# Patient Record
Sex: Male | Born: 1995 | Race: Black or African American | Hispanic: No | Marital: Married | Smoking: Never smoker
Health system: Southern US, Community
[De-identification: ages and names within clinical notes are randomized; demographics above are authoritative.]

---
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
## 2016-02-18 ENCOUNTER — Inpatient Hospital Stay (HOSPITAL_COMMUNITY)
Admission: EM | Admit: 2016-02-18 | Discharge: 2016-02-20 | DRG: 155 | Disposition: A | Discharge status: Home / Self Care | Payer: Commercial Managed Care - HMO | Attending: Internal Medicine | Admitting: Internal Medicine

## 2016-02-18 ENCOUNTER — Emergency Department (HOSPITAL_COMMUNITY): Payer: Commercial Managed Care - HMO

## 2016-02-18 ENCOUNTER — Encounter (HOSPITAL_COMMUNITY): Payer: Self-pay | Admitting: *Deleted

## 2016-02-18 DIAGNOSIS — J351 Hypertrophy of tonsils: Principal | ICD-10-CM | POA: Diagnosis present

## 2016-02-18 DIAGNOSIS — N179 Acute kidney failure, unspecified: Secondary | ICD-10-CM | POA: Diagnosis present

## 2016-02-18 DIAGNOSIS — Z8249 Family history of ischemic heart disease and other diseases of the circulatory system: Secondary | ICD-10-CM | POA: Diagnosis not present

## 2016-02-18 DIAGNOSIS — J029 Acute pharyngitis, unspecified: Secondary | ICD-10-CM | POA: Diagnosis present

## 2016-02-18 DIAGNOSIS — R739 Hyperglycemia, unspecified: Secondary | ICD-10-CM | POA: Diagnosis present

## 2016-02-18 DIAGNOSIS — E860 Dehydration: Secondary | ICD-10-CM

## 2016-02-18 DIAGNOSIS — J039 Acute tonsillitis, unspecified: Secondary | ICD-10-CM | POA: Diagnosis present

## 2016-02-18 LAB — CBC WITH DIFFERENTIAL/PLATELET
Basophils Absolute: 0.4 10*3/uL — ABNORMAL HIGH (ref 0.0–0.1)
Basophils Relative: 2 %
Eosinophils Absolute: 0 10*3/uL (ref 0.0–0.7)
Eosinophils Relative: 0 %
HCT: 42.1 % (ref 39.0–52.0)
Hemoglobin: 13.7 g/dL (ref 13.0–17.0)
Lymphocytes Relative: 45 %
Lymphs Abs: 8.1 10*3/uL — ABNORMAL HIGH (ref 0.7–4.0)
MCH: 27.7 pg (ref 26.0–34.0)
MCHC: 32.5 g/dL (ref 30.0–36.0)
MCV: 85.2 fL (ref 78.0–100.0)
Monocytes Absolute: 2.3 10*3/uL — ABNORMAL HIGH (ref 0.1–1.0)
Monocytes Relative: 13 %
Neutro Abs: 7.2 10*3/uL (ref 1.7–7.7)
Neutrophils Relative %: 40 %
Platelets: 258 10*3/uL (ref 150–400)
RBC: 4.94 MIL/uL (ref 4.22–5.81)
RDW: 13.1 % (ref 11.5–15.5)
WBC: 18 10*3/uL — ABNORMAL HIGH (ref 4.0–10.5)

## 2016-02-18 LAB — I-STAT CHEM 8, ED
BUN: 22 mg/dL — ABNORMAL HIGH (ref 6–20)
Calcium, Ion: 1.11 mmol/L — ABNORMAL LOW (ref 1.15–1.40)
Chloride: 95 mmol/L — ABNORMAL LOW (ref 101–111)
Creatinine, Ser: 1.3 mg/dL — ABNORMAL HIGH (ref 0.61–1.24)
Glucose, Bld: 96 mg/dL (ref 65–99)
HCT: 46 % (ref 39.0–52.0)
Hemoglobin: 15.6 g/dL (ref 13.0–17.0)
Potassium: 3.7 mmol/L (ref 3.5–5.1)
Sodium: 137 mmol/L (ref 135–145)
TCO2: 31 mmol/L (ref 0–100)

## 2016-02-18 LAB — URINALYSIS, ROUTINE W REFLEX MICROSCOPIC
BILIRUBIN URINE: NEGATIVE
GLUCOSE, UA: NEGATIVE mg/dL
HGB URINE DIPSTICK: NEGATIVE
Ketones, ur: NEGATIVE mg/dL
Leukocytes, UA: NEGATIVE
Nitrite: NEGATIVE
Protein, ur: NEGATIVE mg/dL
SPECIFIC GRAVITY, URINE: 1.025 (ref 1.005–1.030)
pH: 7 (ref 5.0–8.0)

## 2016-02-18 MED ORDER — SODIUM CHLORIDE 0.9 % IV SOLN
3.0000 g | Freq: Once | INTRAVENOUS | Status: AC
  Administered 2016-02-18: 3 g via INTRAVENOUS
  Filled 2016-02-18: qty 3

## 2016-02-18 MED ORDER — SODIUM CHLORIDE 0.9 % IV BOLUS (SEPSIS)
1000.0000 mL | Freq: Once | INTRAVENOUS | Status: AC
  Administered 2016-02-18: 1000 mL via INTRAVENOUS

## 2016-02-18 MED ORDER — FENTANYL CITRATE (PF) 100 MCG/2ML IJ SOLN
50.0000 ug | Freq: Once | INTRAMUSCULAR | Status: AC
  Administered 2016-02-18: 50 ug via INTRAVENOUS
  Filled 2016-02-18: qty 2

## 2016-02-18 MED ORDER — SENNA 8.6 MG PO TABS
1.0000 | ORAL_TABLET | Freq: Two times a day (BID) | ORAL | Status: DC
  Administered 2016-02-18 – 2016-02-20 (×3): 8.6 mg via ORAL
  Filled 2016-02-18 (×4): qty 1

## 2016-02-18 MED ORDER — ACETAMINOPHEN 650 MG RE SUPP: 650.0000 mg | Freq: Four times a day (QID) | RECTAL | Status: DC | PRN

## 2016-02-18 MED ORDER — ONDANSETRON HCL 4 MG PO TABS: 4.0000 mg | ORAL_TABLET | Freq: Four times a day (QID) | ORAL | Status: DC | PRN

## 2016-02-18 MED ORDER — IOPAMIDOL (ISOVUE-300) INJECTION 61%
75.0000 mL | Freq: Once | INTRAVENOUS | Status: AC | PRN
  Administered 2016-02-18: 75 mL via INTRAVENOUS

## 2016-02-18 MED ORDER — METHYLPREDNISOLONE SODIUM SUCC 125 MG IJ SOLR
125.0000 mg | Freq: Once | INTRAMUSCULAR | Status: AC
  Administered 2016-02-18: 125 mg via INTRAVENOUS
  Filled 2016-02-18: qty 2

## 2016-02-18 MED ORDER — ONDANSETRON HCL 4 MG/2ML IJ SOLN: 4.0000 mg | Freq: Four times a day (QID) | INTRAMUSCULAR | Status: DC | PRN

## 2016-02-18 MED ORDER — SODIUM CHLORIDE 0.9 % IV SOLN
3.0000 g | Freq: Four times a day (QID) | INTRAVENOUS | Status: DC
  Administered 2016-02-18 – 2016-02-20 (×7): 3 g via INTRAVENOUS
  Filled 2016-02-18 (×8): qty 3

## 2016-02-18 MED ORDER — KETOROLAC TROMETHAMINE 30 MG/ML IJ SOLN
30.0000 mg | Freq: Once | INTRAMUSCULAR | Status: AC
  Administered 2016-02-18: 30 mg via INTRAVENOUS
  Filled 2016-02-18: qty 1

## 2016-02-18 MED ORDER — OXYCODONE HCL 5 MG PO TABS
5.0000 mg | ORAL_TABLET | ORAL | Status: DC | PRN
  Administered 2016-02-18 – 2016-02-20 (×6): 5 mg via ORAL
  Filled 2016-02-18 (×6): qty 1

## 2016-02-18 MED ORDER — ALBUTEROL SULFATE (2.5 MG/3ML) 0.083% IN NEBU: 2.5000 mg | INHALATION_SOLUTION | RESPIRATORY_TRACT | Status: DC | PRN

## 2016-02-18 MED ORDER — ONDANSETRON HCL 4 MG/2ML IJ SOLN
4.0000 mg | Freq: Once | INTRAMUSCULAR | Status: AC
  Administered 2016-02-18: 4 mg via INTRAVENOUS
  Filled 2016-02-18: qty 2

## 2016-02-18 MED ORDER — SODIUM CHLORIDE 0.9 % IV SOLN
INTRAVENOUS | Status: AC
  Administered 2016-02-18 – 2016-02-19 (×2): via INTRAVENOUS

## 2016-02-18 MED ORDER — MORPHINE SULFATE (PF) 2 MG/ML IV SOLN
1.0000 mg | INTRAVENOUS | Status: DC | PRN
  Administered 2016-02-18 – 2016-02-19 (×4): 2 mg via INTRAVENOUS
  Filled 2016-02-18 (×5): qty 1

## 2016-02-18 MED ORDER — ACETAMINOPHEN 325 MG PO TABS: 650.0000 mg | ORAL_TABLET | Freq: Four times a day (QID) | ORAL | Status: DC | PRN

## 2016-02-18 MED ORDER — PHENOL 1.4 % MT LIQD
1.0000 | OROMUCOSAL | Status: DC | PRN
  Administered 2016-02-18: 1 via OROMUCOSAL
  Filled 2016-02-18: qty 177

## 2016-02-18 NOTE — ED Notes (Signed)
RN at bedside collecting labs 

## 2016-02-18 NOTE — ED Provider Notes (Signed)
[facility]-EMERGENCY DEPT Provider Note   CSN: [id number] Arrival date & time: 02/18/16  1332  By signing my name below, I, Soijett Blue, attest that this documentation has been prepared under the direction and in the presence of Rolan BuccoMelanie Azyriah Nevins, MD. Electronically Signed: Soijett Blue, ED Scribe. 02/18/16. 2:40 PM.   History   Chief Complaint Chief Complaint  Patient presents with  . Sore Throat    HPI  Jason Davis is a 20 y.o. male who presents to the Emergency Department complaining of sore throat onset 2.5-3 weeks worsening today. Father states that they took the patient to the dentist first and was then seen by his PCP. Pt reports that following being seen by his PCP he was dx with mono on 9/27 after a positive mono test and had a negative strep test. Pt states that he was then started on a prednisone taper. Pt notes that he began to have blood-tinged sputum and inability to swallow secretions due to sore throat x today and that is what prompted him to come into the ED. Pt denies recent abx use. He states that he is having associated symptoms of blood-tinged sputum, trouble swallowing, dental pain, appetite change, and left neck pain. He states that he has tried Rx prednisone with no relief for his symptoms. He denies nausea, vomiting, fever, abdominal pain, rash, body aches, and any other symptoms.   The history is provided by the patient. No language interpreter was used.    History reviewed. No pertinent past medical history.  Patient Active Problem List   Diagnosis Date Noted  . Tonsillitis, phlegmonous 02/18/2016  . Tonsillitis with exudate 02/18/2016    History reviewed. No pertinent surgical history.     Home Medications    Prior to Admission medications   Medication Sig Start Date End Date Taking? Authorizing Provider  ibuprofen (ADVIL,MOTRIN) 200 MG tablet Take 400 mg by mouth every 6 (six) hours as needed for moderate pain.   Yes Historical Provider, MD    predniSONE (DELTASONE) 20 MG tablet Take 3 tablets daily x 3 days; 2 tablets daily x 3 days; 1 tablet daily x 3 days 02/10/16  Yes Historical Provider, MD    Family History Family History  Problem Relation Age of Onset  . Hypertension Father     Social History Social History  Substance Use Topics  . Smoking status: Never Smoker  . Smokeless tobacco: Never Used  . Alcohol use No     Allergies   Review of patient's allergies indicates no known allergies.   Review of Systems Review of Systems  Constitutional: Positive for fatigue. Negative for chills, diaphoresis and fever.  HENT: Positive for sore throat and trouble swallowing. Negative for congestion, rhinorrhea and sneezing.        +Blood-tinged sputum  Eyes: Negative.   Respiratory: Negative for cough, chest tightness and shortness of breath.   Cardiovascular: Negative for chest pain and leg swelling.  Gastrointestinal: Negative for abdominal pain, blood in stool, diarrhea, nausea and vomiting.  Genitourinary: Negative for difficulty urinating, flank pain, frequency and hematuria.  Musculoskeletal: Positive for neck pain (left sided). Negative for arthralgias, back pain and myalgias.  Skin: Negative for rash.  Neurological: Negative for dizziness, speech difficulty, weakness, numbness and headaches.     Physical Exam Updated Vital Signs BP 127/86 (BP Location: Left Arm)   Pulse 82   Temp 100.2 F (37.9 C) (Oral)   Resp 20   Ht 5\' 8"  (1.727 m)   Wt 149  lb (67.6 kg)   SpO2 98%   BMI 22.66 kg/m   Physical Exam  Constitutional: He is oriented to person, place, and time. He appears well-developed and well-nourished.  HENT:  Head: Normocephalic and atraumatic.  Mouth/Throat: Uvula is midline and mucous membranes are normal. No trismus in the jaw. Posterior oropharyngeal edema and posterior oropharyngeal erythema present. Tonsillar exudate.  Uvula midline. No significant trismus. Posterior oropharyngeal edema and  erythema. Exudates to tonsils bilaterally with eschar formation. No visible bleeding.   Eyes: Pupils are equal, round, and reactive to light.  Neck: Normal range of motion. Neck supple.  +Cervical lymphadenopathy  Cardiovascular: Normal rate, regular rhythm and normal heart sounds.   Pulmonary/Chest: Effort normal and breath sounds normal. No respiratory distress. He has no wheezes. He has no rales. He exhibits no tenderness.  Abdominal: Soft. Bowel sounds are normal. There is no tenderness. There is no rebound and no guarding.  Musculoskeletal: Normal range of motion. He exhibits no edema.  Lymphadenopathy:    He has no cervical adenopathy.  Neurological: He is alert and oriented to person, place, and time.  Skin: Skin is warm and dry. No rash noted.  Psychiatric: He has a normal mood and affect.  Nursing note and vitals reviewed.    ED Treatments / Results  DIAGNOSTIC STUDIES: Oxygen Saturation is 97% on RA, nl by my interpretation.    COORDINATION OF CARE: 2:29 PM Discussed treatment plan with pt at bedside which includes CT soft tissue neck, toradol injection, IV fluids, labs,  and pt agreed to plan.  Labs Labs Reviewed  CBC WITH DIFFERENTIAL/PLATELET - Abnormal; Notable for the following:       Result Value   WBC 18.0 (*)    Lymphs Abs 8.1 (*)    Monocytes Absolute 2.3 (*)    Basophils Absolute 0.4 (*)    All other components within normal limits  I-STAT CHEM 8, ED - Abnormal; Notable for the following:    Chloride 95 (*)    BUN 22 (*)    Creatinine, Ser 1.30 (*)    Calcium, Ion 1.11 (*)    All other components within normal limits  CULTURE, BLOOD (ROUTINE X 2)  CULTURE, BLOOD (ROUTINE X 2)  PATHOLOGIST SMEAR REVIEW    Radiology Ct Soft Tissue Neck W Contrast  Result Date: 02/18/2016 CLINICAL DATA:  Sore throat for 2-3 weeks, worsening today. Diagnosed with mononucleosis 02/10/2016. Negative strep test. EXAM: CT NECK WITH CONTRAST TECHNIQUE: Multidetector CT  imaging of the neck was performed using the standard protocol following the bolus administration of intravenous contrast. CONTRAST:  75mL ISOVUE-300 IOPAMIDOL (ISOVUE-300) INJECTION 61% COMPARISON:  None. FINDINGS: Pharynx and larynx: Striated pattern of nasopharyngeal adenoidal enhancement, palatine tonsillar enhancement, and lingual tonsillar enhancement consistent with tonsillitis/ adenoidal inflammation. Asymmetric hypoattenuation in the region of the LEFT palatine tonsil with slight peritonsillar extension to the masticator space. No well-defined or discrete abscess. No laryngeal edema or airway narrowing. Salivary glands: No inflammation, mass, or stone. Thyroid: Normal. Lymph nodes: Extensive lymphadenopathy greatest in the LEFT level 2 where a hypoattenuating lymph node adjacent to the LEFT internal jugular results in significant mass effect. The node measures 15 x 17 x 14 mm, and could represent a focal abscess. Possible extranodal spread of inflammation posteriorly as seen on image 52 series 5, arrow, deep to the LEFT sternocleidomastoid. Vascular: No evidence for septic thrombophlebitis. Limited intracranial: Negative. Visualized orbits: Negative. Mastoids and visualized paranasal sinuses: Clear. Skeleton: No acute or aggressive process. Upper  chest: No infiltrates, nodules, pneumothorax, or septic emboli. Other: none IMPRESSION: Tonsillar and adenoidal inflammation bilaterally. Asymmetric LEFT tonsillar enlargement, developing phlegmon with early extension to the LEFT masticator space. Extensive lymphadenopathy, greatest at LEFT level 2 where a 15 x 17 x 14 mm lymph node adjacent to the carotid sheath appears necrotic, possible extranodal spread of purulence. These results were called by telephone at the time of interpretation on 02/18/2016 at 4:33 pm to Dr. Shawna Orleans Shaneece Stockburger , who verbally acknowledged these results. Electronically Signed   By: Elsie Stain M.D.   On: 02/18/2016 16:34     Procedures Procedures (including critical care time)  Medications Ordered in ED Medications  sodium chloride 0.9 % bolus 1,000 mL (0 mLs Intravenous Stopped 02/18/16 1619)  ketorolac (TORADOL) 30 MG/ML injection 30 mg (30 mg Intravenous Given 02/18/16 1522)  iopamidol (ISOVUE-300) 61 % injection 75 mL (75 mLs Intravenous Contrast Given 02/18/16 1551)  Ampicillin-Sulbactam (UNASYN) 3 g in sodium chloride 0.9 % 100 mL IVPB (3 g Intravenous New Bag/Given 02/18/16 1624)  methylPREDNISolone sodium succinate (SOLU-MEDROL) 125 mg/2 mL injection 125 mg (125 mg Intravenous Given 02/18/16 1734)  fentaNYL (SUBLIMAZE) injection 50 mcg (50 mcg Intravenous Given 02/18/16 1734)  ondansetron (ZOFRAN) injection 4 mg (4 mg Intravenous Given 02/18/16 1734)     Initial Impression / Assessment and Plan / ED Course  I have reviewed the triage vital signs and the nursing notes.  Pertinent imaging results that were available during my care of the patient were reviewed by me and considered in my medical decision making (see chart for details).  Clinical Course    I reviewed the CT findings. Patient has an extensive amount of cellulitis to his tonsillar areas but no drainable abscess. I spoke with Dr. Jenne Pane who also reviewed the CT. We feel that it would be best to admit the patient overnight for some IV antibiotics and hydration. He does have an acute kidney injury. He is able to tolerate secretions and has no airway involvement. I spoke with the hospitalist, Dr Rito Ehrlich, who is accepted the patient for admission to a full admit, MedSurg bed.  Final Clinical Impressions(s) / ED Diagnoses   Final diagnoses:  Acute pharyngitis, unspecified etiology  AKI (acute kidney injury) (HCC)    New Prescriptions New Prescriptions   No medications on file    I personally performed the services described in this documentation, which was scribed in my presence.  The recorded information has been reviewed and  considered.     Rolan Bucco, MD 02/18/16 [phone]

## 2016-02-18 NOTE — Progress Notes (Signed)
Report given to [address]. Pt to be transferred to 1502. Pt states his pain is now a 2/10 after pain meds.Pt was given juices and tolerated well.PA aware of temp. Pt does have clear sputum.

## 2016-02-18 NOTE — H&P (Signed)
[facility] History and Physical  Jason Davis ZOX:[id number] DOB: August 22, 1995 DOA: 02/18/2016   PCP: No PCP Per Patient  Specialists: None  Chief Complaint: Sore throat  HPI: Jason Davis is a 20 y.o. male with no significant past medical history who was in his usual state of health till about 2.5-3 weeks ago when he started having pain in his mouth and felt the pain in his teeth and his throat. He went to see a dentist. However, no dental problem was found. Subsequently, he was seen by another provider and he was diagnosed with infectious mononucleosis on September 27. Patient apparently had a negative strep test at that time. Patient was started on a prednisone taper. Over the course of the last 7 days or so, his symptoms have been worsening. He has felt more and more pain in his throat. He has coughed up blood tinged sputum. He's had difficulty swallowing as well. He has pain with swallowing and yawning. He has noticed his voice to change. The pain was 8 out of 10 in intensity earlier today. He has felt hot and might have  had a fever. However, he does not have a thermometer. He denies any shortness of breath or chest pain. No dizziness, lightheadedness. No nausea, vomiting or diarrhea. 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 He traveled to [place] about 3 weeks ago and to [place] in August. He denies any sexual contact recently. The last time he was with a partner was about 2 months ago, but he did not have sexual intercourse. Patient mentions that he is not yet sexually active.  In the emergency department, patient was evaluated and underwent a CT scan of the neck which showed severe tonsillitis with no clearcut abscess. Patient is noted to be dehydrated. He will be hospitalized for further management.  Home Medications: Prior to Admission medications   Medication Sig Start Date End Date Taking? Authorizing Provider  ibuprofen  (ADVIL,MOTRIN) 200 MG tablet Take 400 mg by mouth every 6 (six) hours as needed for moderate pain.   Yes Historical Provider, MD  predniSONE (DELTASONE) 20 MG tablet Take 3 tablets daily x 3 days; 2 tablets daily x 3 days; 1 tablet daily x 3 days 02/10/16  Yes Historical Provider, MD    Allergies: No Known Allergies  Past Medical History: Patient denies any medical problems in the past  Social History: He lives in [place]. He works in Youth [employer] and in [employer], which is a gym. He denies smoking, alcohol use or illicit drug use.   Family History:  Family History  Problem Relation Age of Onset  . Hypertension Father      Review of Systems - History obtained from the patient General ROS: positive for  - fatigue Psychological ROS: negative Ophthalmic ROS: negative ENT ROS: as in hpi Allergy and Immunology ROS: negative Hematological and Lymphatic ROS: swollen glands in neck Endocrine ROS: negative Respiratory ROS: as in hpi Cardiovascular ROS: no chest pain or dyspnea on exertion Gastrointestinal ROS: no abdominal pain, change in bowel habits, or black or bloody stools Genito-Urinary ROS: no dysuria, trouble voiding, or hematuria Musculoskeletal ROS: negative Neurological ROS: no TIA or stroke symptoms Dermatological ROS: negative  Physical Examination  Vitals:   02/18/16 1349 02/18/16 1357 02/18/16 1700 02/18/16 1713  BP: 142/70  140/62 127/86  Pulse: 92  80 82  Resp: 19  18 20   Temp: 99.1 F (37.3 C)  98.9 F (37.2  C) 100.2 F (37.9 C)  TempSrc: Oral  Oral Oral  SpO2: 97%   98%  Weight:  67.6 kg (149 lb)    Height:  5\' 8"  (1.727 m)      BP 127/86 (BP Location: Left Arm)   Pulse 82   Temp 100.2 F (37.9 C) (Oral)   Resp 20   Ht 5\' 8"  (1.727 m)   Wt 67.6 kg (149 lb)   SpO2 98%   BMI 22.66 kg/m   General appearance: alert, cooperative, appears stated age and no distress Head: Normocephalic, without obvious abnormality, atraumatic Eyes:  conjunctivae/corneas clear. PERRL, EOM's intact.  Throat: Halitosis is noted. Significantly enlarged left tonsil with exudates is noted. He has enlarged lymph nodes in the anterior cervical chain bilaterally, left more than right. Also has enlarged submandibular lymph nodes. Neck: Appears to be slightly swollen bilaterally, left more than right. Enlarged lymph nodes described above. No stridor. Resp: clear to auscultation bilaterally Cardio: regular rate and rhythm, S1, S2 normal, no murmur, click, rub or gallop GI: soft, non-tender; bowel sounds normal; no masses,  no organomegaly Extremities: extremities normal, atraumatic, no cyanosis or edema Pulses: 2+ and symmetric Skin: Skin color, texture, turgor normal. No rashes or lesions Lymph nodes: Cervical adenopathy: As described above Neurologic: Alert and oriented 3. Cranial nerves II-12 intact. Motor strength equal bilateral upper and lower extremity.   Labs on Admission: I have personally reviewed following labs and imaging studies  CBC:  Recent Labs Lab 02/18/16 1435 02/18/16 1528  WBC 18.0*  --   NEUTROABS 7.2  --   HGB 13.7 15.6  HCT 42.1 46.0  MCV 85.2  --   PLT 258  --    Basic Metabolic Panel:  Recent Labs Lab 02/18/16 1528  NA 137  K 3.7  CL 95*  GLUCOSE 96  BUN 22*  CREATININE 1.30*   GFR: Estimated Creatinine Clearance: 87.4 mL/min (by C-G formula based on SCr of 1.3 mg/dL (H)).   Radiological Exams on Admission: Ct Soft Tissue Neck W Contrast  Result Date: 02/18/2016 CLINICAL DATA:  Sore throat for 2-3 weeks, worsening today. Diagnosed with mononucleosis 02/10/2016. Negative strep test. EXAM: CT NECK WITH CONTRAST TECHNIQUE: Multidetector CT imaging of the neck was performed using the standard protocol following the bolus administration of intravenous contrast. CONTRAST:  75mL ISOVUE-300 IOPAMIDOL (ISOVUE-300) INJECTION 61% COMPARISON:  None. FINDINGS: Pharynx and larynx: Striated pattern of nasopharyngeal  adenoidal enhancement, palatine tonsillar enhancement, and lingual tonsillar enhancement consistent with tonsillitis/ adenoidal inflammation. Asymmetric hypoattenuation in the region of the LEFT palatine tonsil with slight peritonsillar extension to the masticator space. No well-defined or discrete abscess. No laryngeal edema or airway narrowing. Salivary glands: No inflammation, mass, or stone. Thyroid: Normal. Lymph nodes: Extensive lymphadenopathy greatest in the LEFT level 2 where a hypoattenuating lymph node adjacent to the LEFT internal jugular results in significant mass effect. The node measures 15 x 17 x 14 mm, and could represent a focal abscess. Possible extranodal spread of inflammation posteriorly as seen on image 52 series 5, arrow, deep to the LEFT sternocleidomastoid. Vascular: No evidence for septic thrombophlebitis. Limited intracranial: Negative. Visualized orbits: Negative. Mastoids and visualized paranasal sinuses: Clear. Skeleton: No acute or aggressive process. Upper chest: No infiltrates, nodules, pneumothorax, or septic emboli. Other: none IMPRESSION: Tonsillar and adenoidal inflammation bilaterally. Asymmetric LEFT tonsillar enlargement, developing phlegmon with early extension to the LEFT masticator space. Extensive lymphadenopathy, greatest at LEFT level 2 where a 15 x 17 x 14 mm lymph  node adjacent to the carotid sheath appears necrotic, possible extranodal spread of purulence. These results were called by telephone at the time of interpretation on 02/18/2016 at 4:33 pm to Dr. Shawna Orleans BELFI , who verbally acknowledged these results. Electronically Signed   By: Elsie Stain M.D.   On: 02/18/2016 16:34      Problem List  Principal Problem:   Tonsillitis with exudate Active Problems:   Tonsillitis, phlegmonous   Assessment: This is a 20 year old African-American male who presents with 2-3 week history of worsening sore throat. He was initially diagnosed with infectious  mononucleosis. Currently has evidence for severe tonsillitis. There is evidence for phlegmon but no clear cut abscess. He is able to clear his airway.  Plan: #1 acute tonsillitis with phlegmon and exudates: Patient merits hospitalization for intravenous antibiotics due to severity of his illness. The case has been discussed by the ED provider with Dr. Jenne Pane with ENT. He recommends treating the patient with antibiotics. Patient has been given a dose of Solu-Medrol in the ED. No clear indication to continue the same at this time. He was recently on a tapering course of steroids. Patient will be continued on Unasyn. We will get blood cultures. If he improves, then he can follow up with ENT in their office. Pain control. Would ideally like to use NSAIDs but the patient does have mildly elevated creatinine, so we will hold off for now.  #2 Dehydration with mild acute renal failure: BUN and creatinine are mildly elevated. He will be given IV fluids. Labs will be repeated in the morning. Monitor urine output. Check UA.  DVT Prophylaxis: SCDs Code Status: Full code Family Communication: Discussed with the patient and his mother  Disposition Plan: Admit to MedSurg bed  Consults called: ED physician discussed with Dr. Jenne Pane with ENT  Admission status: Inpatient status as patient with severe tonsillitis with the phlegmon requiring intravenous antibiotics and monitoring for airway compromise.    Further management decisions will depend on results of further testing and patient's response to treatment.   [facility]  [facility] Pager [phone]  If 7PM-7AM, please contact night-coverage [email or web address] Password [email or web address]  02/18/2016, 5:56 PM

## 2016-02-18 NOTE — ED Triage Notes (Addendum)
Patient was dx with mono on 9/27 and started on steroids for swollen tonsils.  His strep was negative (per Care Everywhere).  Patient now presents with sore throat and left cervical LAD.  Patient's oropharynx is exceptionally red with exudate on left side, tonsils 3+ and uvula offset to right side.  Patient's breath is malodorous.  Patient denies N/V/D and fever.  Patient states it is difficult to swallow.

## 2016-02-19 DIAGNOSIS — N179 Acute kidney failure, unspecified: Secondary | ICD-10-CM

## 2016-02-19 DIAGNOSIS — J039 Acute tonsillitis, unspecified: Secondary | ICD-10-CM

## 2016-02-19 DIAGNOSIS — R739 Hyperglycemia, unspecified: Secondary | ICD-10-CM

## 2016-02-19 LAB — CBC
HEMATOCRIT: 40.5 % (ref 39.0–52.0)
Hemoglobin: 13.3 g/dL (ref 13.0–17.0)
MCH: 27.8 pg (ref 26.0–34.0)
MCHC: 32.8 g/dL (ref 30.0–36.0)
MCV: 84.6 fL (ref 78.0–100.0)
Platelets: 265 10*3/uL (ref 150–400)
RBC: 4.79 MIL/uL (ref 4.22–5.81)
RDW: 13.1 % (ref 11.5–15.5)
WBC: 15.6 10*3/uL — ABNORMAL HIGH (ref 4.0–10.5)

## 2016-02-19 LAB — BASIC METABOLIC PANEL
Anion gap: 8 (ref 5–15)
BUN: 18 mg/dL (ref 6–20)
CO2: 29 mmol/L (ref 22–32)
Calcium: 8.9 mg/dL (ref 8.9–10.3)
Chloride: 98 mmol/L — ABNORMAL LOW (ref 101–111)
Creatinine, Ser: 1.18 mg/dL (ref 0.61–1.24)
GFR calc Af Amer: >60 mL/min (ref >60)
GLUCOSE: 128 mg/dL — AB (ref 65–99)
POTASSIUM: 4.8 mmol/L (ref 3.5–5.1)
Sodium: 135 mmol/L (ref 135–145)

## 2016-02-19 LAB — PATHOLOGIST SMEAR REVIEW

## 2016-02-19 MED ORDER — IBUPROFEN 200 MG PO TABS
400.0000 mg | ORAL_TABLET | Freq: Three times a day (TID) | ORAL | Status: DC
  Administered 2016-02-19 – 2016-02-20 (×3): 400 mg via ORAL
  Filled 2016-02-19 (×3): qty 2

## 2016-02-19 MED ORDER — MORPHINE SULFATE (PF) 2 MG/ML IV SOLN: 1.0000 mg | INTRAVENOUS | Status: DC | PRN

## 2016-02-19 NOTE — Progress Notes (Signed)
[facility] PROGRESS NOTE  Jason LaymanChristopher J Davis ZOX:[id number] DOB: 05/30/1995 DOA: 02/18/2016 PCP: No PCP Per Patient  Interim summary and HPI 20 y.o. male with no significant past medical history who was in his usual state of health till about 2.5-3 weeks ago when he started having pain in his mouth and felt the pain in his teeth and his throat. He went to see a dentist. However, no dental problem was found. Subsequently, he was seen by another provider and he was diagnosed with infectious mononucleosis on September 27. Patient apparently had a negative strep test at that time. Patient was started on a prednisone taper. Over the course of the last 7 days or so, his symptoms have been worsening. He has felt more and more pain in his throat. He has coughed up blood tinged sputum. He's had difficulty swallowing as well. He has pain with swallowing and yawning. He has noticed his voice to change. The pain was 8 out of 10 in intensity earlier today. He has felt hot and might have  had a fever. However, he does not have a thermometer. He denies any shortness of breath or chest pain. No dizziness, lightheadedness. No nausea, vomiting or diarrhea. 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  Assessment/Plan: 1. Acute tonsillitis with phlegmon and exudates: -improving -will continue PRN analgesics -IVF's -continue IV antibiotics and supportive care -will start ibuprofen to help with inflammation   2-AKI: pre-renal in nature -improved/essentially resolved after IVF's given -will continue IVF's -will follow trend, specially with use of NSAID's  3-hyperglycemia: -due to use of steroids recently -no prior hx of diabetes   Code Status: Full Family Communication: dad and mother at bedside  Disposition Plan: will advance diet to full liquid, continue IVF's, start use of ibuprofen and continue IV antibiotics. Will follow clinical response; hopefully home in  am.   Consultants:  ENT (Dr. Jenne PaneBates) consulted by ED provider   Procedures:  See below for x-ray reports   Antibiotics:  unasyn -->>02/18/16  HPI/Subjective: Afebrile currently; still complaining of significant throat pain and difficulty swallowing   Objective: Vitals:   02/18/16 1848 02/19/16 0520  BP: 113/79 115/61  Pulse: 95 (!) 54  Resp: 20 18  Temp: 99.5 F (37.5 C) 98.4 F (36.9 C)    Intake/Output Summary (Last 24 hours) at 02/19/16 1218 Last data filed at 02/19/16 1000  Gross per 24 hour  Intake          2298.34 ml  Output             2000 ml  Net           298.34 ml   Filed Weights   02/18/16 1357  Weight: 67.6 kg (149 lb)    Exam:   General: afebrile, no CP and no SOB. Patient reports difficulty swallowing and with ongoing throat pain.  ENT: no ears or nostrils drainage; throat with positive swelling in his tonsils and with positive exudates  Cardiovascular: S1 and S2, no rubs o rgallops  Respiratory: CTA bilaterally   Abdomen: soft, NT, ND, positive BS  Musculoskeletal: no edema or cyanosis   Data Reviewed: Basic Metabolic Panel:  Recent Labs Lab 02/18/16 1528 02/19/16 0528  NA 137 135  K 3.7 4.8  CL 95* 98*  CO2  --  29  GLUCOSE 96 128*  BUN 22* 18  CREATININE 1.30* 1.18  CALCIUM  --  8.9   CBC:  Recent Labs  Lab 02/18/16 1435 02/18/16 1528 02/19/16 0528  WBC 18.0*  --  15.6*  NEUTROABS 7.2  --   --   HGB 13.7 15.6 13.3  HCT 42.1 46.0 40.5  MCV 85.2  --  84.6  PLT 258  --  265   CBG: No results for input(s): GLUCAP in the last 168 hours.  Recent Results (from the past 240 hour(s))  Culture, blood (Routine X 2) w Reflex to ID Panel     Status: None (Preliminary result)   Collection Time: 02/18/16  8:35 PM  Result Value Ref Range Status   Specimen Description BLOOD RIGHT HAND  Final   Special Requests   Final    BOTTLES DRAWN AEROBIC AND ANAEROBIC 5CC AER ,2CC ANA   Culture PENDING  Incomplete   Report Status  PENDING  Incomplete     Studies: Ct Soft Tissue Neck W Contrast  Result Date: 02/18/2016 CLINICAL DATA:  Sore throat for 2-3 weeks, worsening today. Diagnosed with mononucleosis 02/10/2016. Negative strep test. EXAM: CT NECK WITH CONTRAST TECHNIQUE: Multidetector CT imaging of the neck was performed using the standard protocol following the bolus administration of intravenous contrast. CONTRAST:  75mL ISOVUE-300 IOPAMIDOL (ISOVUE-300) INJECTION 61% COMPARISON:  None. FINDINGS: Pharynx and larynx: Striated pattern of nasopharyngeal adenoidal enhancement, palatine tonsillar enhancement, and lingual tonsillar enhancement consistent with tonsillitis/ adenoidal inflammation. Asymmetric hypoattenuation in the region of the LEFT palatine tonsil with slight peritonsillar extension to the masticator space. No well-defined or discrete abscess. No laryngeal edema or airway narrowing. Salivary glands: No inflammation, mass, or stone. Thyroid: Normal. Lymph nodes: Extensive lymphadenopathy greatest in the LEFT level 2 where a hypoattenuating lymph node adjacent to the LEFT internal jugular results in significant mass effect. The node measures 15 x 17 x 14 mm, and could represent a focal abscess. Possible extranodal spread of inflammation posteriorly as seen on image 52 series 5, arrow, deep to the LEFT sternocleidomastoid. Vascular: No evidence for septic thrombophlebitis. Limited intracranial: Negative. Visualized orbits: Negative. Mastoids and visualized paranasal sinuses: Clear. Skeleton: No acute or aggressive process. Upper chest: No infiltrates, nodules, pneumothorax, or septic emboli. Other: none IMPRESSION: Tonsillar and adenoidal inflammation bilaterally. Asymmetric LEFT tonsillar enlargement, developing phlegmon with early extension to the LEFT masticator space. Extensive lymphadenopathy, greatest at LEFT level 2 where a 15 x 17 x 14 mm lymph node adjacent to the carotid sheath appears necrotic, possible  extranodal spread of purulence. These results were called by telephone at the time of interpretation on 02/18/2016 at 4:33 pm to Dr. Shawna Orleans BELFI , who verbally acknowledged these results. Electronically Signed   By: Elsie Stain M.D.   On: 02/18/2016 16:34    Scheduled Meds: . ampicillin-sulbactam (UNASYN) IV  3 g Intravenous Q6H  . ibuprofen  400 mg Oral TID  . senna  1 tablet Oral BID   Continuous Infusions: . sodium chloride 100 mL/hr at 02/19/16 1610    Principal Problem:   Tonsillitis with exudate Active Problems:   Tonsillitis, phlegmonous   ARF (acute renal failure) (HCC)    Time spent: 25 minutes    Vassie Loll  [facility] Pager [phone]. If 7PM-7AM, please contact night-coverage at [email or web address], password [facility] 02/19/2016, 12:18 PM  LOS: 1 day

## 2016-02-20 DIAGNOSIS — J029 Acute pharyngitis, unspecified: Secondary | ICD-10-CM

## 2016-02-20 DIAGNOSIS — N179 Acute kidney failure, unspecified: Secondary | ICD-10-CM

## 2016-02-20 DIAGNOSIS — E860 Dehydration: Secondary | ICD-10-CM

## 2016-02-20 LAB — CBC
HCT: 39.7 % (ref 39.0–52.0)
Hemoglobin: 12.8 g/dL — ABNORMAL LOW (ref 13.0–17.0)
MCH: 27.5 pg (ref 26.0–34.0)
MCHC: 32.2 g/dL (ref 30.0–36.0)
MCV: 85.2 fL (ref 78.0–100.0)
Platelets: 220 10*3/uL (ref 150–400)
RBC: 4.66 MIL/uL (ref 4.22–5.81)
RDW: 13.2 % (ref 11.5–15.5)
WBC: 10 10*3/uL (ref 4.0–10.5)

## 2016-02-20 LAB — BASIC METABOLIC PANEL
Anion gap: 3 — ABNORMAL LOW (ref 5–15)
BUN: 15 mg/dL (ref 6–20)
CALCIUM: 8.5 mg/dL — AB (ref 8.9–10.3)
CO2: 32 mmol/L (ref 22–32)
CREATININE: 1.09 mg/dL (ref 0.61–1.24)
Chloride: 99 mmol/L — ABNORMAL LOW (ref 101–111)
GFR calc non Af Amer: >60 mL/min (ref >60)
Glucose, Bld: 86 mg/dL (ref 65–99)
Potassium: 4.5 mmol/L (ref 3.5–5.1)
SODIUM: 134 mmol/L — AB (ref 135–145)

## 2016-02-20 MED ORDER — OXYCODONE HCL 5 MG PO TABS
5.0000 mg | ORAL_TABLET | Freq: Four times a day (QID) | ORAL | 0 refills | Status: AC | PRN
Start: 2016-02-20 — End: ?

## 2016-02-20 MED ORDER — IBUPROFEN 600 MG PO TABS: 600.0000 mg | ORAL_TABLET | Freq: Three times a day (TID) | ORAL | 0 refills | Status: AC | PRN

## 2016-02-20 MED ORDER — AMOXICILLIN-POT CLAVULANATE 875-125 MG PO TABS: 1.0000 | ORAL_TABLET | Freq: Two times a day (BID) | ORAL | 0 refills | Status: AC

## 2016-02-20 MED ORDER — SACCHAROMYCES BOULARDII 250 MG PO CAPS: 250.0000 mg | ORAL_CAPSULE | Freq: Two times a day (BID) | ORAL | 0 refills | Status: AC

## 2016-02-20 MED ORDER — PHENOL 1.4 % MT LIQD: 1.0000 | OROMUCOSAL | Status: AC | PRN

## 2016-02-20 NOTE — Discharge Instructions (Signed)

## 2016-02-20 NOTE — Progress Notes (Signed)
Patient discharged.  Leaving with personal belongings, prescriptions, and return to work note.  Reports understanding of discharge instructions.  Accompanied by father.  Denies pain, room air, no s/s of distress.  No complaints.

## 2016-02-20 NOTE — Discharge Summary (Signed)
Physician Discharge Summary  Jason Davis [facility] NFA:[id number] DOB: 1995-06-22 DOA: 02/18/2016  PCP: No PCP Per Patient  Admit date: 02/18/2016 Discharge date: 02/20/2016  Time spent: 35 minutes  Recommendations for Outpatient Follow-up:  1. Repeat BMET to follow electrolytes and renal function    Discharge Diagnoses:  Principal Problem:   Acute pharyngitis Active Problems:   Tonsillitis, phlegmonous   ARF (acute renal failure) (HCC)   AKI (acute kidney injury) (HCC)   Dehydration   Discharge Condition: stable and improved. Discharge home with instructions to follow up with Dr. Jenne Pane (ENT) as an outpatient and with PCP in 10 days.  Diet recommendation: full liquid diet (with instructions to advance slowly as tolerated)  Filed Weights   02/18/16 1357  Weight: 67.6 kg (149 lb)    History of present illness:  20 y.o.malewith no significant past medical history who was in his usual state of health till about 2.5-3 weeks ago when he started having pain in his mouth and felt the pain in his teeth and his throat. He went to see a dentist. However, no dental problem was found. Subsequently, he was seen by another provider and he was diagnosed with infectious mononucleosis on September 27. Patient apparently had a negative strep test at that time. Patient was started on a prednisone taper. Over the course of the last 7 days or so, his symptoms have been worsening. He hasfelt more and more pain in his throat. He hascoughed up blood tinged sputum. He's had difficulty swallowing as well. He has pain with swallowing and yawning. He has noticed his voice to change. The pain was 8 out of 10 in intensity earlier today. He has felt hot and might have had a fever. However, he does not have a thermometer. He denies any shortness of breath or chest pain. No dizziness, lightheadedness. No nausea, vomiting or diarrhea. 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  Hospital Course:  1-Acute tonsillitis with phlegmon and exudates: -continue improving -will continue PRN analgesics (ibuprofen and oxycodone for severe pain) -advise to keep himself hydrated and to stick to full liquid diet at the beginning  -will discharge on Augmentin with intentions to complete 10 more days of antibiotics  -patient received 3 days of unasyn during this admission as well -PRN chloraseptic spray also instructed   2-dehydration/AKI: pre-renal in nature -improved/resolved  -advise to keep himself hydrated and educated to prevent overuse of NSAID's -Cr 1.09 at discharge  3-hyperglycemia: -due to use of steroids recently -no prior hx of diabetes -CBG < 100 at discharge  Procedures:  See below for x-ray reports   Consultations:  ENT (Dr. Jenne Pane; consulted by ED physician)  Discharge Exam: Vitals:   02/19/16 2239 02/20/16 0555  BP: 109/66 123/73  Pulse: 64 (!) 59  Resp: 18 18  Temp: 98 F (36.7 C) 98 F (36.7 C)    General: afebrile, no CP and no SOB. Patient reports mild difficulty swallowing and ongoing throat pain; but improved overall and able to tolerate full liquid diet and to take PO antibiotics.  ENT: no ears or nostrils drainage; throat with positive swelling in his tonsils and with positive exudates. No drainage appreciated.  Cardiovascular: S1 and S2, no rubs o rgallops  Respiratory: CTA bilaterally   Abdomen: soft, NT, ND, positive BS  Musculoskeletal: no edema or cyanosis    Discharge Instructions   Discharge Instructions    Discharge instructions    Complete by:  As directed    Advance diet slowly (sticking mainly to full liquid initially) Keep yourself well hydrated  Take medications as prescribed Please follow up with Dr. Jenne Pane (ENT); call office to set up appointment     Current Discharge Medication List    START taking these medications   Details  amoxicillin-clavulanate (AUGMENTIN) 875-125 MG tablet Take 1  tablet by mouth 2 (two) times daily. Qty: 20 tablet, Refills: 0    oxyCODONE (OXY IR/ROXICODONE) 5 MG immediate release tablet Take 1 tablet (5 mg total) by mouth every 6 (six) hours as needed for severe pain. Qty: 20 tablet, Refills: 0    phenol (CHLORASEPTIC) 1.4 % LIQD Use as directed 1 spray in the mouth or throat as needed for throat irritation / pain.    saccharomyces boulardii (FLORASTOR) 250 MG capsule Take 1 capsule (250 mg total) by mouth 2 (two) times daily. Qty: 40 capsule, Refills: 0      CONTINUE these medications which have CHANGED   Details  ibuprofen (ADVIL,MOTRIN) 600 MG tablet Take 1 tablet (600 mg total) by mouth every 8 (eight) hours as needed for moderate pain. Use schedule for the first three days. Qty: 30 tablet, Refills: 0      STOP taking these medications     predniSONE (DELTASONE) 20 MG tablet        No Known Allergies Follow-up Information    BATES, DWIGHT, MD. Call today.   Specialty:  Otolaryngology Why:  office for appointment details  Contact information: [address] [place] [address] [phone]           The results of significant diagnostics from this hospitalization (including imaging, microbiology, ancillary and laboratory) are listed below for reference.    Significant Diagnostic Studies: Ct Soft Tissue Neck W Contrast  Result Date: 02/18/2016 CLINICAL DATA:  Sore throat for 2-3 weeks, worsening today. Diagnosed with mononucleosis 02/10/2016. Negative strep test. EXAM: CT NECK WITH CONTRAST TECHNIQUE: Multidetector CT imaging of the neck was performed using the standard protocol following the bolus administration of intravenous contrast. CONTRAST:  75mL ISOVUE-300 IOPAMIDOL (ISOVUE-300) INJECTION 61% COMPARISON:  None. FINDINGS: Pharynx and larynx: Striated pattern of nasopharyngeal adenoidal enhancement, palatine tonsillar enhancement, and lingual tonsillar enhancement consistent with tonsillitis/ adenoidal  inflammation. Asymmetric hypoattenuation in the region of the LEFT palatine tonsil with slight peritonsillar extension to the masticator space. No well-defined or discrete abscess. No laryngeal edema or airway narrowing. Salivary glands: No inflammation, mass, or stone. Thyroid: Normal. Lymph nodes: Extensive lymphadenopathy greatest in the LEFT level 2 where a hypoattenuating lymph node adjacent to the LEFT internal jugular results in significant mass effect. The node measures 15 x 17 x 14 mm, and could represent a focal abscess. Possible extranodal spread of inflammation posteriorly as seen on image 52 series 5, arrow, deep to the LEFT sternocleidomastoid. Vascular: No evidence for septic thrombophlebitis. Limited intracranial: Negative. Visualized orbits: Negative. Mastoids and visualized paranasal sinuses: Clear. Skeleton: No acute or aggressive process. Upper chest: No infiltrates, nodules, pneumothorax, or septic emboli. Other: none IMPRESSION: Tonsillar and adenoidal inflammation bilaterally. Asymmetric LEFT tonsillar enlargement, developing phlegmon with early extension to the LEFT masticator space. Extensive lymphadenopathy, greatest at LEFT level 2 where a 15 x 17 x 14 mm lymph node adjacent to the carotid sheath appears necrotic, possible extranodal spread of purulence. These results were called by telephone at the time of interpretation on 02/18/2016 at 4:33 pm to Dr. Shawna Orleans Davis , who verbally acknowledged these results. Electronically  Signed   By: Elsie StainJohn T Curnes M.D.   On: 02/18/2016 16:34    Microbiology: Recent Results (from the past 240 hour(s))  Culture, blood (Routine X 2) w Reflex to ID Panel     Status: None (Preliminary result)   Collection Time: 02/18/16  8:35 PM  Result Value Ref Range Status   Specimen Description BLOOD RIGHT HAND  Final   Special Requests   Final    BOTTLES DRAWN AEROBIC AND ANAEROBIC 5CC AER ,2CC ANA   Culture PENDING  Incomplete   Report Status PENDING   Incomplete     Labs: Basic Metabolic Panel:  Recent Labs Lab 02/18/16 1528 02/19/16 0528 02/20/16 0605  NA 137 135 134*  K 3.7 4.8 4.5  CL 95* 98* 99*  CO2  --  29 32  GLUCOSE 96 128* 86  BUN 22* 18 15  CREATININE 1.30* 1.18 1.09  CALCIUM  --  8.9 8.5*   CBC:  Recent Labs Lab 02/18/16 1435 02/18/16 1528 02/19/16 0528 02/20/16 0605  WBC 18.0*  --  15.6* 10.0  NEUTROABS 7.2  --   --   --   HGB 13.7 15.6 13.3 12.8*  HCT 42.1 46.0 40.5 39.7  MCV 85.2  --  84.6 85.2  PLT 258  --  265 220    Signed:  Vassie LollMadera, Lei Dower MD.  [facility] 02/20/2016, 1:29 PM

## 2016-02-24 LAB — CULTURE, BLOOD (ROUTINE X 2)
CULTURE: NO GROWTH
Culture: NO GROWTH

## 2016-02-27 ENCOUNTER — Encounter (HOSPITAL_COMMUNITY): Payer: Self-pay | Admitting: *Deleted

## 2016-02-27 ENCOUNTER — Emergency Department (HOSPITAL_COMMUNITY)
Admission: EM | Admit: 2016-02-27 | Discharge: 2016-02-27 | Disposition: A | Discharge status: Home / Self Care | Payer: Managed Care, Other (non HMO) | Attending: Emergency Medicine | Admitting: Emergency Medicine

## 2016-02-27 DIAGNOSIS — Z79899 Other long term (current) drug therapy: Secondary | ICD-10-CM | POA: Diagnosis not present

## 2016-02-27 DIAGNOSIS — T7840XA Allergy, unspecified, initial encounter: Secondary | ICD-10-CM | POA: Diagnosis not present

## 2016-02-27 MED ORDER — METHYLPREDNISOLONE SODIUM SUCC 125 MG IJ SOLR
125.0000 mg | Freq: Once | INTRAMUSCULAR | Status: AC
  Administered 2016-02-27: 125 mg via INTRAVENOUS
  Filled 2016-02-27: qty 2

## 2016-02-27 MED ORDER — DIPHENHYDRAMINE HCL 50 MG/ML IJ SOLN
25.0000 mg | Freq: Once | INTRAMUSCULAR | Status: AC
  Administered 2016-02-27: 25 mg via INTRAVENOUS
  Filled 2016-02-27: qty 1

## 2016-02-27 MED ORDER — PREDNISONE 50 MG PO TABS: ORAL_TABLET | ORAL | 0 refills | Status: AC

## 2016-02-27 MED ORDER — FAMOTIDINE IN NACL 20-0.9 MG/50ML-% IV SOLN
20.0000 mg | Freq: Once | INTRAVENOUS | Status: AC
  Administered 2016-02-27: 20 mg via INTRAVENOUS
  Filled 2016-02-27: qty 50

## 2016-02-27 MED ORDER — SODIUM CHLORIDE 0.9 % IV BOLUS (SEPSIS)
1000.0000 mL | Freq: Once | INTRAVENOUS | Status: AC
  Administered 2016-02-27: 1000 mL via INTRAVENOUS

## 2016-02-27 NOTE — ED Provider Notes (Signed)
[facility]-EMERGENCY DEPT Provider Note   CSN: [id number] Arrival date & time: 02/27/16  1027     History   Chief Complaint Chief Complaint  Patient presents with  . Rash  . Sore Throat    HPI THEUS ESPIN is a 20 y.o. male.  Status post recent admission for pharyngitis and dehydration. He was discharged home on Augmentin and finished the antibiotic yesterday. He now complains of a pruritic rash on his entire body. No trouble swallowing or breathing. No fever, sweats, chills.      History reviewed. No pertinent past medical history.  Patient Active Problem List   Diagnosis Date Noted  . AKI (acute kidney injury) (HCC)   . Dehydration   . Tonsillitis, phlegmonous 02/18/2016  . Acute pharyngitis 02/18/2016  . ARF (acute renal failure) (HCC) 02/18/2016    History reviewed. No pertinent surgical history.     Home Medications    Prior to Admission medications   Medication Sig Start Date End Date Taking? Authorizing Provider  acetaminophen (TYLENOL) 500 MG tablet Take 500-1,000 mg by mouth every 6 (six) hours as needed for moderate pain.   Yes Historical Provider, MD  ibuprofen (ADVIL,MOTRIN) 600 MG tablet Take 1 tablet (600 mg total) by mouth every 8 (eight) hours as needed for moderate pain. Use schedule for the first three days. 02/20/16  Yes Vassie Loll, MD  oxyCODONE (OXY IR/ROXICODONE) 5 MG immediate release tablet Take 1 tablet (5 mg total) by mouth every 6 (six) hours as needed for severe pain. 02/20/16  Yes Vassie Loll, MD  phenol (CHLORASEPTIC) 1.4 % LIQD Use as directed 1 spray in the mouth or throat as needed for throat irritation / pain. 02/20/16  Yes Vassie Loll, MD  amoxicillin-clavulanate (AUGMENTIN) 875-125 MG tablet Take 1 tablet by mouth 2 (two) times daily. Patient not taking: Reported on 02/27/2016 02/20/16 03/01/16  Vassie Loll, MD  predniSONE (DELTASONE) 50 MG tablet One tablet 02/27/16   Donnetta Hutching, MD  saccharomyces boulardii (FLORASTOR)  250 MG capsule Take 1 capsule (250 mg total) by mouth 2 (two) times daily. Patient not taking: Reported on 02/27/2016 02/20/16   Vassie Loll, MD    Family History Family History  Problem Relation Age of Onset  . Hypertension Father     Social History Social History  Substance Use Topics  . Smoking status: Never Smoker  . Smokeless tobacco: Never Used  . Alcohol use No     Allergies   Review of patient's allergies indicates no known allergies.   Review of Systems Review of Systems  All other systems reviewed and are negative.    Physical Exam Updated Vital Signs BP 115/62   Pulse 61   Temp 98.5 F (36.9 C)   Resp 16   Ht 5\' 8"  (1.727 m)   Wt 149 lb (67.6 kg)   SpO2 98%   BMI 22.66 kg/m   Physical Exam  Constitutional: He is oriented to person, place, and time. He appears well-developed and well-nourished.  No acute distress.  HENT:  Head: Normocephalic and atraumatic.  Eyes: Conjunctivae are normal.  Neck: Neck supple.  Cardiovascular: Normal rate and regular rhythm.   Pulmonary/Chest: Effort normal and breath sounds normal.  Abdominal: Soft. Bowel sounds are normal.  Musculoskeletal: Normal range of motion.  Neurological: He is alert and oriented to person, place, and time.  Skin:  Pruritic diffuse papular rash each unit approximately 1 mm diameter.  Psychiatric: He has a normal mood and affect. His behavior is  normal.  Nursing note and vitals reviewed.    ED Treatments / Results  Labs (all labs ordered are listed, but only abnormal results are displayed) Labs Reviewed - No data to display  EKG  EKG Interpretation None       Radiology No results found.  Procedures Procedures (including critical care time)  Medications Ordered in ED Medications  sodium chloride 0.9 % bolus 1,000 mL (1,000 mLs Intravenous New Bag/Given 02/27/16 1309)  diphenhydrAMINE (BENADRYL) injection 25 mg (25 mg Intravenous Given 02/27/16 1309)  methylPREDNISolone  sodium succinate (SOLU-MEDROL) 125 mg/2 mL injection 125 mg (125 mg Intravenous Given 02/27/16 1309)  famotidine (PEPCID) IVPB 20 mg premix (20 mg Intravenous New Bag/Given 02/27/16 1309)     Initial Impression / Assessment and Plan / ED Course  I have reviewed the triage vital signs and the nursing notes.  Pertinent labs & imaging results that were available during my care of the patient were reviewed by me and considered in my medical decision making (see chart for details).  Clinical Course    Patient feels much better after IV fluids, IV Solu-Medrol, IV Pepcid, IV Benadryl. Discharge medications prednisone for 1 week.  Final Clinical Impressions(s) / ED Diagnoses   Final diagnoses:  Allergic reaction, initial encounter    New Prescriptions New Prescriptions   PREDNISONE (DELTASONE) 50 MG TABLET    One tablet     Donnetta HutchingBrian Geoge Lawrance, MD 02/27/16 1702

## 2016-02-27 NOTE — Discharge Instructions (Signed)
Take 1 prednisone tablet daily for 1 week. Recommend daily Claritin which is over-the-counter.

## 2016-02-27 NOTE — ED Triage Notes (Addendum)
Patient presents alone to ED from home where he lives with his parents.  Patient was admitted to hospital for pharyngitis on 02/18/16 and has a dx of mononucleosis prior to admission.  He was discharged on 10/7 with an rx for Augmentin, which he took until yesterday.  Patient stopped taking this medication because he broke out in diffuse flat pruritic rash, with swelling to ears, face and lips.  The rash and swelling began yesterday.  Patient also has rx for Percocet and has taken that and ibuprofen "a couple times."  Patient denies N/V/D.  Patient has diffuse flat papular rash to torso, extremities, palms of hands, feet and face.  Mild LAD noted to left anterior cervical lymph nodes - present last week.

## 2018-05-01 IMAGING — CT CT NECK W/ CM
4 series · 15 of 33 positions shown, 18 images · IV contrast (iopamidol)
Comparison: None.

CLINICAL DATA: Sore throat for 2-3 weeks, worsening today.
Diagnosed with mononucleosis 02/10/2016. Negative strep test.

EXAM:
CT NECK WITH CONTRAST
TECHNIQUE: Multidetector CT imaging of the neck was performed using the
standard protocol following the bolus administration of intravenous
contrast.
CONTRAST:  75mL 71WR01-HII IOPAMIDOL (71WR01-HII) INJECTION 61%

[Series 3: coronal st · coronal · 0.51mm/px · 3 of 115 slices shown]
[im 23/115  bone]
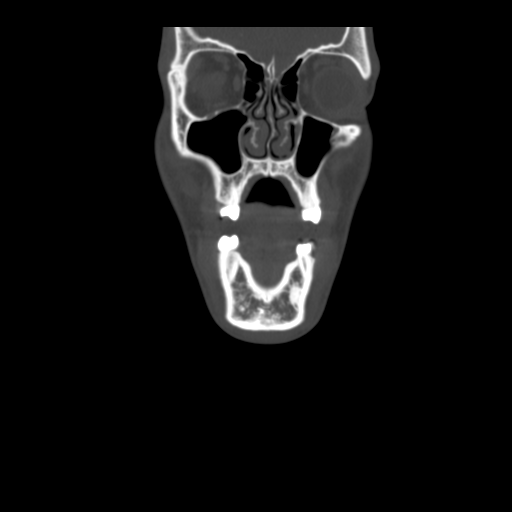
[im 46/115  bone]
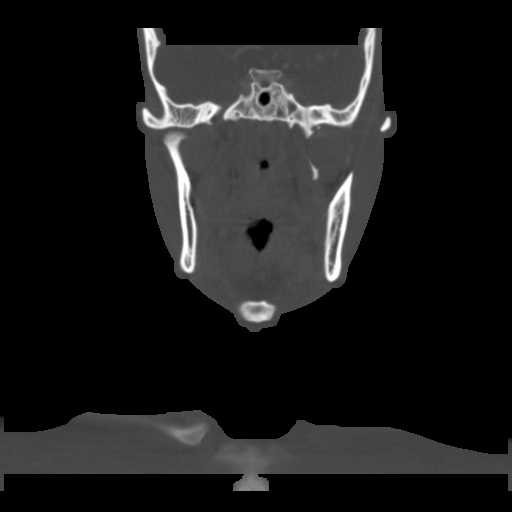
[im 69/115  bone]
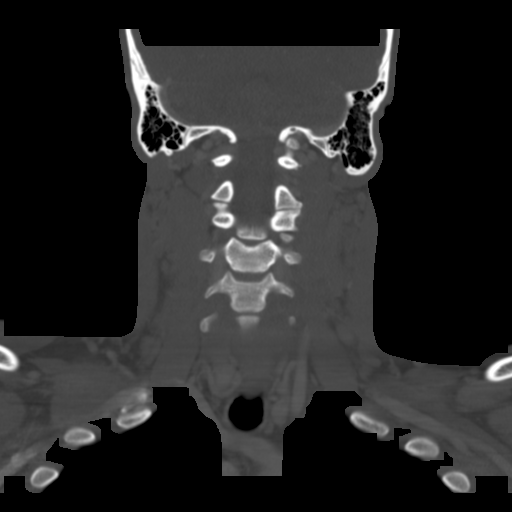

[Series 4: sagittal st · sagittal · 0.51mm/px · 5 of 101 slices shown, 6 images]
[im 34/101  bone]
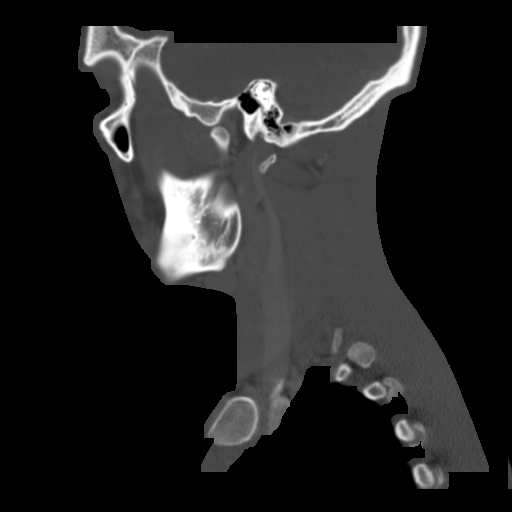
[im 42/101  bone]
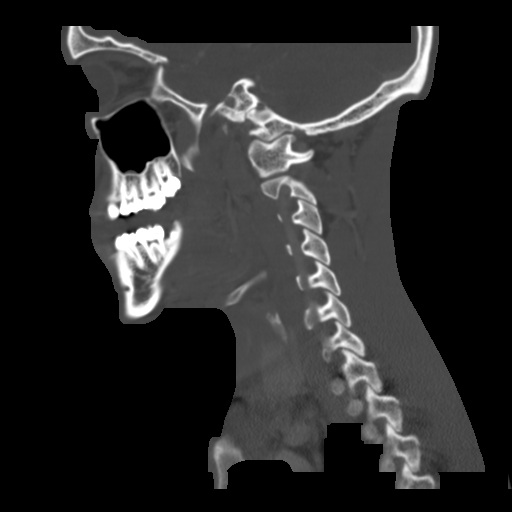
[im 51/101  soft-tissue]
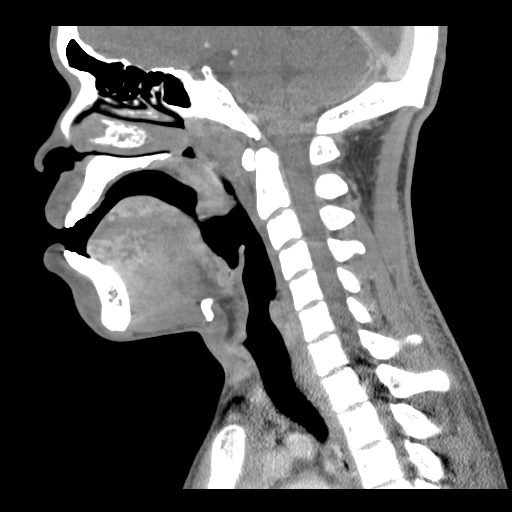
[im 51/101  bone]
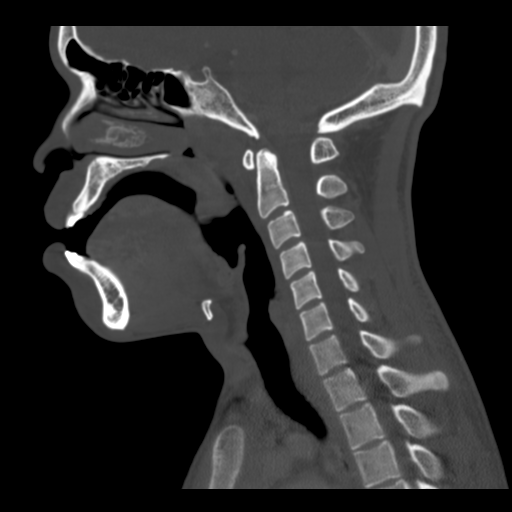
[im 59/101  bone]
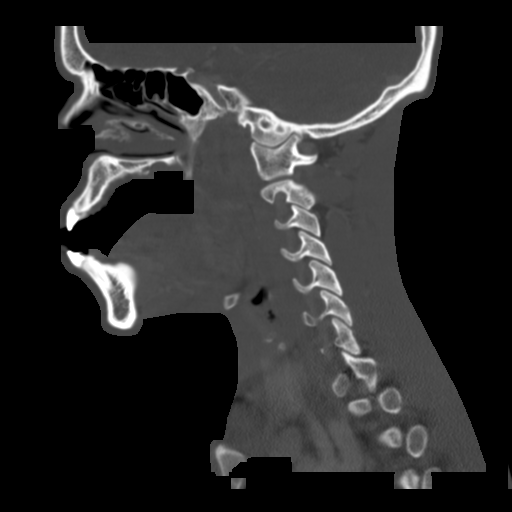
[im 67/101  bone]
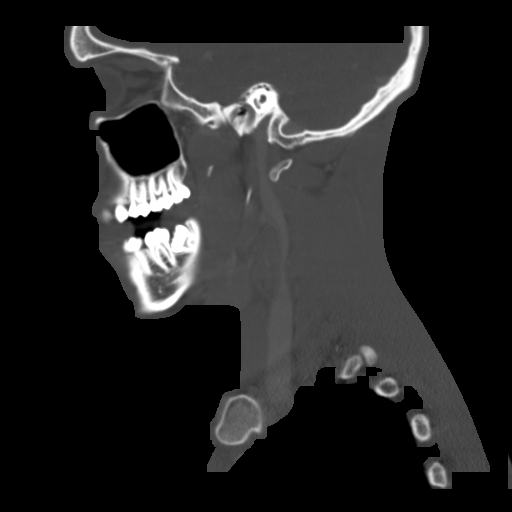

[Series 5: ax axial recons · axial · 0.39mm/px · z∈[-270,-116]mm · 5 of 117 slices shown, 7 images]
[im 20/117  soft-tissue]
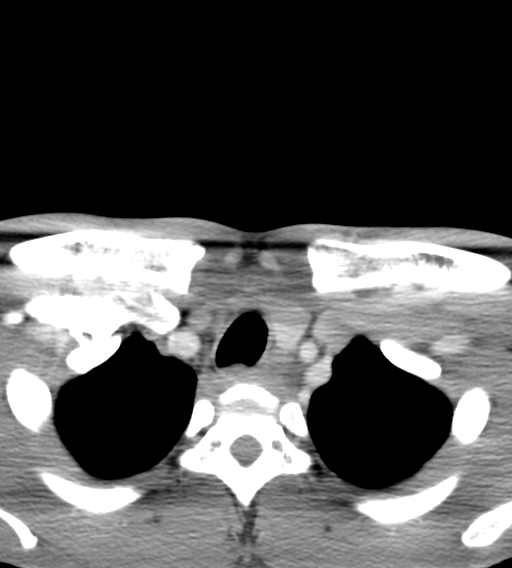
[im 20/117  bone]
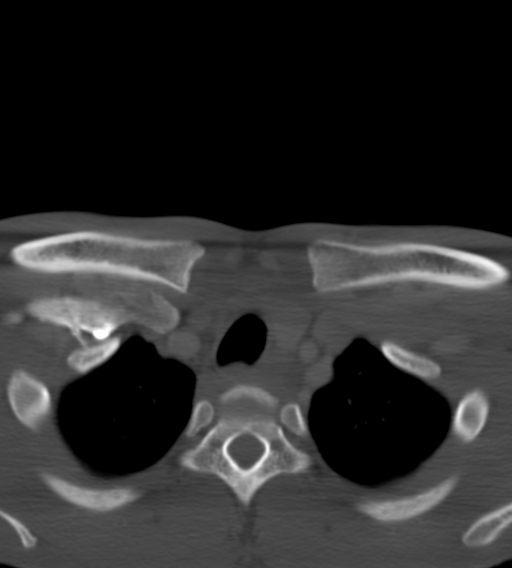
[im 39/117  bone]
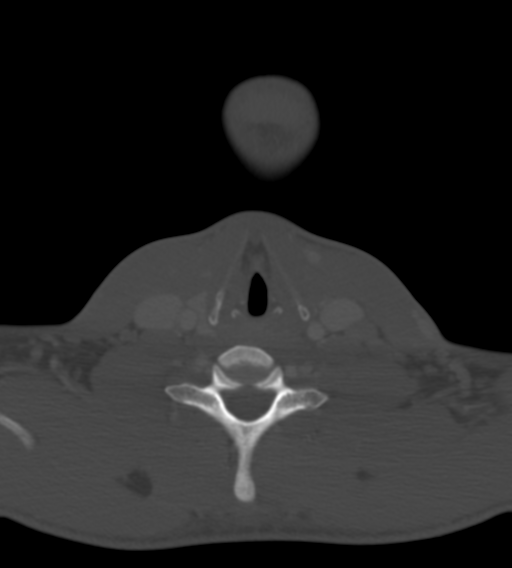
[im 59/117  bone]
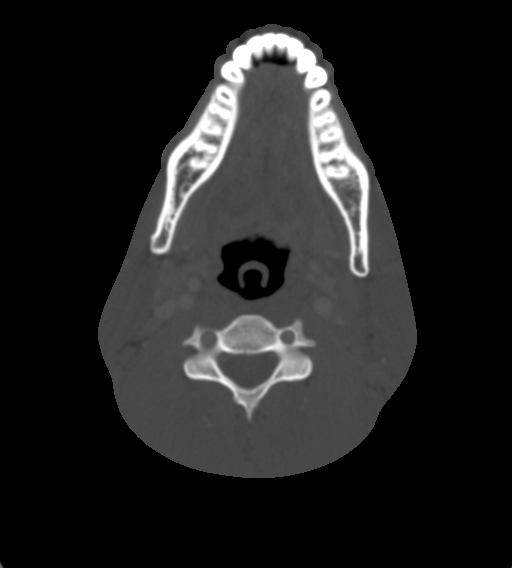
[im 78/117  bone]
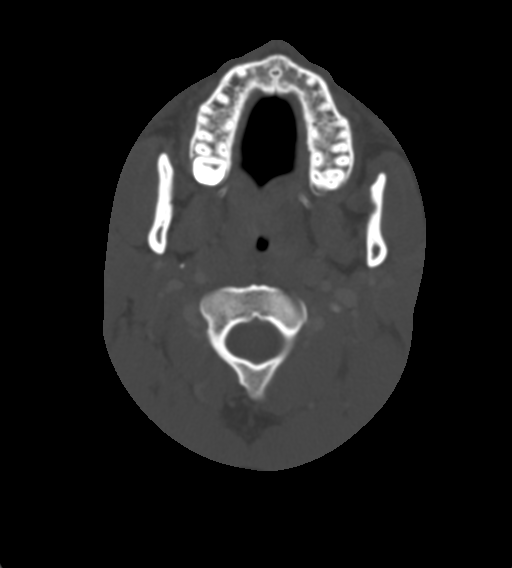
[im 97/117  soft-tissue]
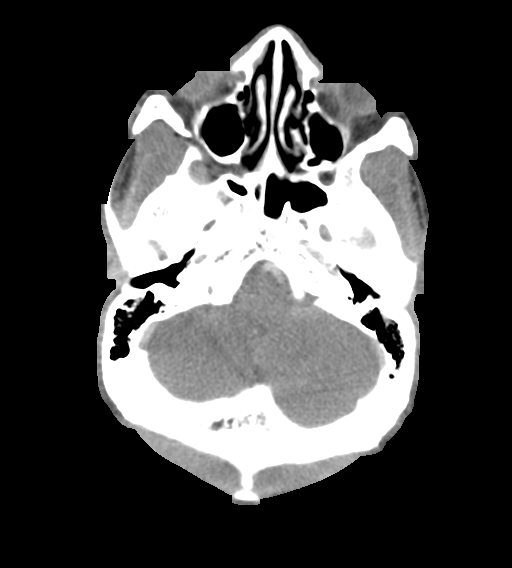
[im 97/117  bone]
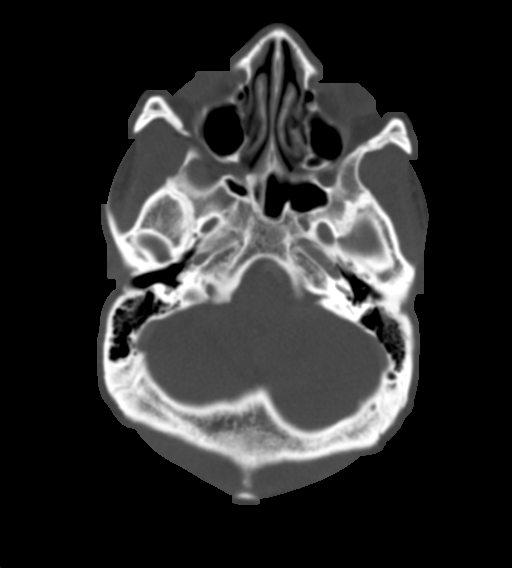

[Series 8: neck with st · axial · 0.44mm/px · z∈[-270,-230]mm · 2 of 118 slices shown]
[im 20/118  bone]
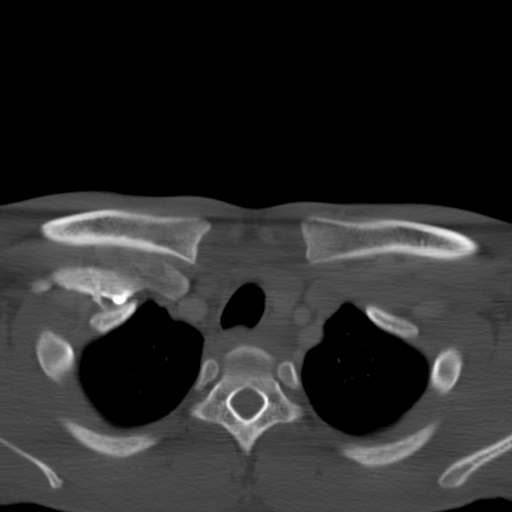
[im 40/118  bone]
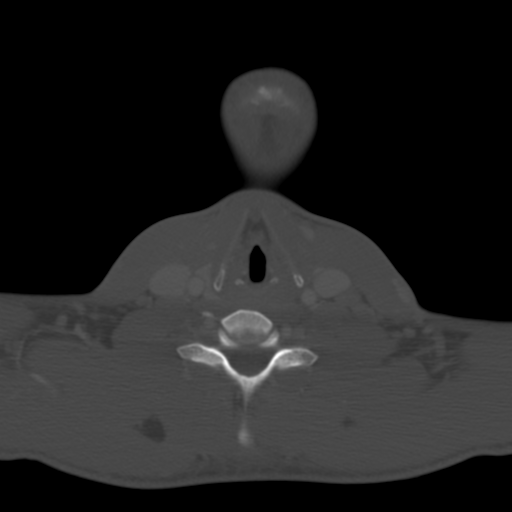

[15 of 33 positions shown; findings below may reference images not displayed]

FINDINGS: Pharynx and larynx: Striated pattern of nasopharyngeal adenoidal
enhancement, palatine tonsillar enhancement, and lingual tonsillar
enhancement consistent with tonsillitis/ adenoidal inflammation.
Asymmetric hypoattenuation in the region of the LEFT palatine tonsil
with slight peritonsillar extension to the masticator space. No
well-defined or discrete abscess. No laryngeal edema or airway
narrowing.

Salivary glands: No inflammation, mass, or stone.

Thyroid: Normal.

Lymph nodes: Extensive lymphadenopathy greatest in the LEFT level 2
where a hypoattenuating lymph node adjacent to the LEFT internal
jugular results in significant mass effect. The node measures 15 x
17 x 14 mm, and could represent a focal abscess. Possible extranodal
spread of inflammation posteriorly as seen on image 52 series 5,
arrow, deep to the LEFT sternocleidomastoid.

Vascular: No evidence for septic thrombophlebitis.

Limited intracranial: Negative.

Visualized orbits: Negative.

Mastoids and visualized paranasal sinuses: Clear.

Skeleton: No acute or aggressive process.

Upper chest: No infiltrates, nodules, pneumothorax, or septic
emboli.

Other: none
IMPRESSION: Tonsillar and adenoidal inflammation bilaterally. Asymmetric LEFT
tonsillar enlargement, developing phlegmon with early extension to
the LEFT masticator space.

Extensive lymphadenopathy, greatest at LEFT level 2 where a 15 x 17
x 14 mm lymph node adjacent to the carotid sheath appears necrotic,
possible extranodal spread of purulence.

These results were called by telephone at the time of interpretation
on 02/18/2016 at [DATE] to Dr. TAPSY CAPABLE , who verbally
acknowledged these results.
